# Patient Record
Sex: Female | Born: 1965 | Race: White | Hispanic: No | Marital: Married | Smoking: Never smoker
Health system: Southern US, Community
[De-identification: ages and names within clinical notes are randomized; demographics above are authoritative.]

## PROBLEM LIST (undated history)

## (undated) DIAGNOSIS — I10 Essential (primary) hypertension: Secondary | ICD-10-CM

## (undated) DIAGNOSIS — C801 Malignant (primary) neoplasm, unspecified: Secondary | ICD-10-CM

---

## 2020-02-22 ENCOUNTER — Emergency Department (HOSPITAL_COMMUNITY): Payer: Self-pay

## 2020-02-22 ENCOUNTER — Encounter (HOSPITAL_COMMUNITY): Payer: Self-pay | Admitting: Emergency Medicine

## 2020-02-22 ENCOUNTER — Inpatient Hospital Stay (HOSPITAL_COMMUNITY)
Admission: EM | Admit: 2020-02-22 | Discharge: 2020-03-16 | DRG: 951 | Disposition: E | Payer: Self-pay | Attending: Internal Medicine | Admitting: Internal Medicine

## 2020-02-22 ENCOUNTER — Other Ambulatory Visit: Payer: Self-pay

## 2020-02-22 DIAGNOSIS — Z923 Personal history of irradiation: Secondary | ICD-10-CM

## 2020-02-22 DIAGNOSIS — C7880 Secondary malignant neoplasm of unspecified digestive organ: Secondary | ICD-10-CM | POA: Diagnosis present

## 2020-02-22 DIAGNOSIS — C799 Secondary malignant neoplasm of unspecified site: Secondary | ICD-10-CM | POA: Diagnosis present

## 2020-02-22 DIAGNOSIS — E871 Hypo-osmolality and hyponatremia: Secondary | ICD-10-CM | POA: Diagnosis present

## 2020-02-22 DIAGNOSIS — K668 Other specified disorders of peritoneum: Secondary | ICD-10-CM

## 2020-02-22 DIAGNOSIS — R531 Weakness: Secondary | ICD-10-CM

## 2020-02-22 DIAGNOSIS — Z515 Encounter for palliative care: Principal | ICD-10-CM | POA: Diagnosis present

## 2020-02-22 DIAGNOSIS — A4159 Other Gram-negative sepsis: Secondary | ICD-10-CM | POA: Diagnosis present

## 2020-02-22 DIAGNOSIS — I959 Hypotension, unspecified: Secondary | ICD-10-CM | POA: Diagnosis present

## 2020-02-22 DIAGNOSIS — Z7189 Other specified counseling: Secondary | ICD-10-CM

## 2020-02-22 DIAGNOSIS — Z79899 Other long term (current) drug therapy: Secondary | ICD-10-CM

## 2020-02-22 DIAGNOSIS — I10 Essential (primary) hypertension: Secondary | ICD-10-CM | POA: Diagnosis present

## 2020-02-22 DIAGNOSIS — C7951 Secondary malignant neoplasm of bone: Secondary | ICD-10-CM | POA: Diagnosis present

## 2020-02-22 DIAGNOSIS — K631 Perforation of intestine (nontraumatic): Secondary | ICD-10-CM | POA: Diagnosis present

## 2020-02-22 DIAGNOSIS — N179 Acute kidney failure, unspecified: Secondary | ICD-10-CM | POA: Diagnosis present

## 2020-02-22 DIAGNOSIS — C55 Malignant neoplasm of uterus, part unspecified: Secondary | ICD-10-CM | POA: Diagnosis present

## 2020-02-22 DIAGNOSIS — Z66 Do not resuscitate: Secondary | ICD-10-CM | POA: Diagnosis present

## 2020-02-22 DIAGNOSIS — E860 Dehydration: Secondary | ICD-10-CM | POA: Diagnosis present

## 2020-02-22 DIAGNOSIS — Z20822 Contact with and (suspected) exposure to covid-19: Secondary | ICD-10-CM | POA: Diagnosis present

## 2020-02-22 HISTORY — DX: Malignant (primary) neoplasm, unspecified: C80.1

## 2020-02-22 HISTORY — DX: Essential (primary) hypertension: I10

## 2020-02-22 LAB — CBC WITH DIFFERENTIAL/PLATELET
Abs Immature Granulocytes: 0.21 10*3/uL — ABNORMAL HIGH (ref 0.00–0.07)
Basophils Absolute: 0.1 10*3/uL (ref 0.0–0.1)
Basophils Relative: 1 %
Eosinophils Absolute: 0 10*3/uL (ref 0.0–0.5)
Eosinophils Relative: 0 %
HCT: 30.2 % — ABNORMAL LOW (ref 36.0–46.0)
Hemoglobin: 10.1 g/dL — ABNORMAL LOW (ref 12.0–15.0)
Immature Granulocytes: 1 %
Lymphocytes Relative: 2 %
Lymphs Abs: 0.4 10*3/uL — ABNORMAL LOW (ref 0.7–4.0)
MCH: 31.7 pg (ref 26.0–34.0)
MCHC: 33.4 g/dL (ref 30.0–36.0)
MCV: 94.7 fL (ref 80.0–100.0)
Monocytes Absolute: 0.3 10*3/uL (ref 0.1–1.0)
Monocytes Relative: 2 %
Neutro Abs: 16.2 10*3/uL — ABNORMAL HIGH (ref 1.7–7.7)
Neutrophils Relative %: 94 %
Platelets: 234 10*3/uL (ref 150–400)
RBC: 3.19 MIL/uL — ABNORMAL LOW (ref 3.87–5.11)
RDW: 18.1 % — ABNORMAL HIGH (ref 11.5–15.5)
WBC: 17.2 10*3/uL — ABNORMAL HIGH (ref 4.0–10.5)
nRBC: 0.2 % (ref 0.0–0.2)

## 2020-02-22 LAB — COMPREHENSIVE METABOLIC PANEL
ALT: 20 U/L (ref 0–44)
AST: 14 U/L — ABNORMAL LOW (ref 15–41)
Albumin: 2.1 g/dL — ABNORMAL LOW (ref 3.5–5.0)
Alkaline Phosphatase: 77 U/L (ref 38–126)
Anion gap: 17 — ABNORMAL HIGH (ref 5–15)
BUN: 76 mg/dL — ABNORMAL HIGH (ref 6–20)
CO2: 20 mmol/L — ABNORMAL LOW (ref 22–32)
Calcium: 7.6 mg/dL — ABNORMAL LOW (ref 8.9–10.3)
Chloride: 89 mmol/L — ABNORMAL LOW (ref 98–111)
Creatinine, Ser: 3.78 mg/dL — ABNORMAL HIGH (ref 0.44–1.00)
GFR calc Af Amer: 15 mL/min — ABNORMAL LOW (ref >60)
GFR calc non Af Amer: 13 mL/min — ABNORMAL LOW (ref >60)
Glucose, Bld: 106 mg/dL — ABNORMAL HIGH (ref 70–99)
Potassium: 3.7 mmol/L (ref 3.5–5.1)
Sodium: 126 mmol/L — ABNORMAL LOW (ref 135–145)
Total Bilirubin: 1.1 mg/dL (ref 0.3–1.2)
Total Protein: 5.5 g/dL — ABNORMAL LOW (ref 6.5–8.1)

## 2020-02-22 LAB — LACTIC ACID, PLASMA: Lactic Acid, Venous: 2.1 mmol/L (ref 0.5–1.9)

## 2020-02-22 LAB — SARS CORONAVIRUS 2 BY RT PCR (HOSPITAL ORDER, PERFORMED IN ~~LOC~~ HOSPITAL LAB): SARS Coronavirus 2: NEGATIVE

## 2020-02-22 MED ORDER — HALOPERIDOL LACTATE 2 MG/ML PO CONC
0.5000 mg | ORAL | Status: DC | PRN
  Filled 2020-02-22: qty 0.3

## 2020-02-22 MED ORDER — ONDANSETRON HCL 4 MG/2ML IJ SOLN: 4.0000 mg | Freq: Four times a day (QID) | INTRAMUSCULAR | Status: DC | PRN

## 2020-02-22 MED ORDER — BIOTENE DRY MOUTH MT LIQD: 15.0000 mL | OROMUCOSAL | Status: DC | PRN

## 2020-02-22 MED ORDER — METRONIDAZOLE IN NACL 5-0.79 MG/ML-% IV SOLN
500.0000 mg | Freq: Once | INTRAVENOUS | Status: AC
  Administered 2020-02-22: 500 mg via INTRAVENOUS
  Filled 2020-02-22: qty 100

## 2020-02-22 MED ORDER — ONDANSETRON HCL 4 MG/2ML IJ SOLN
4.0000 mg | Freq: Once | INTRAMUSCULAR | Status: AC
  Administered 2020-02-22: 4 mg via INTRAVENOUS
  Filled 2020-02-22: qty 2

## 2020-02-22 MED ORDER — HALOPERIDOL 0.5 MG PO TABS
0.5000 mg | ORAL_TABLET | ORAL | Status: DC | PRN
  Filled 2020-02-22: qty 1

## 2020-02-22 MED ORDER — GLYCOPYRROLATE 0.2 MG/ML IJ SOLN: 0.2000 mg | INTRAMUSCULAR | Status: DC | PRN

## 2020-02-22 MED ORDER — GLYCOPYRROLATE 0.2 MG/ML IJ SOLN
0.2000 mg | INTRAMUSCULAR | Status: DC | PRN
  Administered 2020-02-23 (×2): 0.2 mg via INTRAVENOUS
  Filled 2020-02-22 (×2): qty 1

## 2020-02-22 MED ORDER — GLYCOPYRROLATE 1 MG PO TABS
1.0000 mg | ORAL_TABLET | ORAL | Status: DC | PRN
  Filled 2020-02-22: qty 1

## 2020-02-22 MED ORDER — LORAZEPAM 2 MG/ML IJ SOLN
1.0000 mg | INTRAMUSCULAR | Status: DC | PRN
  Administered 2020-02-23: 1 mg via INTRAVENOUS
  Filled 2020-02-22: qty 1

## 2020-02-22 MED ORDER — ONDANSETRON 4 MG PO TBDP: 4.0000 mg | ORAL_TABLET | Freq: Four times a day (QID) | ORAL | Status: DC | PRN

## 2020-02-22 MED ORDER — MORPHINE SULFATE (PF) 2 MG/ML IV SOLN
2.0000 mg | INTRAVENOUS | Status: DC | PRN
  Administered 2020-02-23: 2 mg via INTRAVENOUS
  Filled 2020-02-22: qty 1

## 2020-02-22 MED ORDER — ACETAMINOPHEN 325 MG PO TABS: 650.0000 mg | ORAL_TABLET | Freq: Four times a day (QID) | ORAL | Status: DC | PRN

## 2020-02-22 MED ORDER — ACETAMINOPHEN 650 MG RE SUPP: 650.0000 mg | Freq: Four times a day (QID) | RECTAL | Status: DC | PRN

## 2020-02-22 MED ORDER — LORAZEPAM 2 MG/ML PO CONC: 1.0000 mg | ORAL | Status: DC | PRN

## 2020-02-22 MED ORDER — IOHEXOL 9 MG/ML PO SOLN
ORAL | Status: AC
  Filled 2020-02-22: qty 1000

## 2020-02-22 MED ORDER — MORPHINE SULFATE (PF) 4 MG/ML IV SOLN
4.0000 mg | Freq: Once | INTRAVENOUS | Status: DC
  Filled 2020-02-22: qty 1

## 2020-02-22 MED ORDER — HALOPERIDOL LACTATE 5 MG/ML IJ SOLN: 0.5000 mg | INTRAMUSCULAR | Status: DC | PRN

## 2020-02-22 MED ORDER — LORAZEPAM 1 MG PO TABS: 1.0000 mg | ORAL_TABLET | ORAL | Status: DC | PRN

## 2020-02-22 MED ORDER — SODIUM CHLORIDE 0.9 % IV BOLUS (SEPSIS)
1000.0000 mL | Freq: Once | INTRAVENOUS | Status: AC
  Administered 2020-02-22: 1000 mL via INTRAVENOUS

## 2020-02-22 MED ORDER — SODIUM CHLORIDE 0.9 % IV BOLUS
1000.0000 mL | Freq: Once | INTRAVENOUS | Status: AC
  Administered 2020-02-22: 1000 mL via INTRAVENOUS

## 2020-02-22 MED ORDER — POLYVINYL ALCOHOL 1.4 % OP SOLN: 1.0000 [drp] | Freq: Four times a day (QID) | OPHTHALMIC | Status: DC | PRN

## 2020-02-22 MED ORDER — SODIUM CHLORIDE 0.9 % IV SOLN
2.0000 g | Freq: Once | INTRAVENOUS | Status: AC
  Administered 2020-02-22: 2 g via INTRAVENOUS
  Filled 2020-02-22: qty 2

## 2020-02-22 MED ORDER — SODIUM CHLORIDE 0.9 % IV SOLN: INTRAVENOUS | Status: DC

## 2020-02-22 NOTE — ED Triage Notes (Signed)
Pt from home on hospice care. [facility] was called out for transport, hospice caregiver stated pt has confusion, low bp, cough, and retaining fluid. Ems states bp was 103/40 on arrival.

## 2020-02-22 NOTE — H&P (Signed)
[facility] History and Physical  Sherry Arnold ZOX:[id number] DOB: 07-20-1965 DOA: 03/10/2020   PCP: Bridget Hartshorn, NP  Specialists: Patient followed by oncology at [facility] in [place] Complaint: Abdominal pain  HPI: Sherry Arnold is a 54 y.o. female with a past medical history of metastatic uterine cancer who was under hospice care at home.  She was seen by oncology at [facility].  It appears the patient was diagnosed with the cancer back in May.  She apparently underwent radiation treatments but does not look like she received any chemotherapy.  Unclear when the patient was transitioned to hospice but the patient presented mainly because of her weakness poor oral intake and abdominal pain.  She also developed abdominal distention.  She presented to the ED.  Currently patient is noted to be somewhat drowsy after receiving some medications.  Her husband is at the bedside.  Patient mentions that her abdominal pain is better at this time.  In the emergency department she underwent CT scan which showed evidence for bowel perforation likely secondary to a large mass noted in the abdomen which was encasing the bowel loops.  Patient has extremely poor prognosis.  She is not a surgical candidate.  This is likely a terminal event.  Patient and her husband understand.  She is also noted to be hypotensive.  Currently not stable to be sent back home.  She would however like to go home tomorrow if possible.  She will be hospitalized for pain control.  Home Medications: Prior to Admission medications   Medication Sig Start Date End Date Taking? Authorizing Provider  ibuprofen (ADVIL) 800 MG tablet Take 800 mg by mouth every 8 (eight) hours as needed. 12/24/19  Yes [provider]  lisinopril-hydrochlorothiazide (ZESTORETIC) 20-25 MG tablet Take 1 tablet by mouth daily. 12/29/19  Yes [provider]  Vitamin D, Ergocalciferol, (DRISDOL) 1.25 MG (50000 UNIT)  CAPS capsule Take 50,000 Units by mouth once a week. 12/20/19  Yes [provider]    Allergies: No Known Allergies  Past Medical History: Past Medical History:  Diagnosis Date  . Cancer ([place])   . Hypertension     History reviewed. No pertinent surgical history.  Social History: Lives with her husband.  No other information is available at this time.  Family History:  Unable to obtain as patient is drowsy.  Review of Systems -unable to do as the patient is drowsy  Physical Examination  Vitals:   02/26/2020 1755 02/26/2020 1756 02/27/2020 1900 02/21/2020 2028  BP:  (!) 74/44 (!) 50/34 (!) 140/123  Pulse: 98  63 88  Resp:   (!) 21 (!) 24  Temp:    97.6 F (36.4 C)  TempSrc:      SpO2: 98%  (!) 77%   Weight:    117.9 kg  Height:        BP (!) 140/123 (BP Location: Right Arm)   Pulse 88   Temp 97.6 F (36.4 C)   Resp (!) 24   Ht 5\' 5"  (1.651 m)   Wt 117.9 kg   SpO2 (!) 77%   BMI 43.25 kg/m   General appearance: alert, distracted, fatigued and no distress Head: Normocephalic, without obvious abnormality, atraumatic Eyes: conjunctivae/corneas clear.  Pupils are equal and reactive Neck: no adenopathy, no carotid bruit, no JVD, supple, symmetrical, trachea midline and thyroid not enlarged, symmetric, no tenderness/mass/nodules Resp: Crackles and rhonchi diffusely bilaterally.  Noted to be tachypneic. Cardio: regular rate and rhythm, S1,  S2 normal, no murmur, click, rub or gallop GI: Abdomen is significantly distended tender diffusely.  Some rigidity is appreciated. Extremities: Edema bilateral lower extremities Skin: Skin color, texture, turgor normal. No rashes or lesions Lymph nodes: Cervical, supraclavicular, and axillary nodes normal. Neurologic: Drowsy but arousable.  No obvious focal deficits noted.    Labs on Admission: I have personally reviewed following labs and imaging studies  CBC: Recent Labs  Lab 03/11/2020 1423  WBC 17.2*  NEUTROABS 16.2*  HGB  10.1*  HCT 30.2*  MCV 94.7  PLT 660   Basic Metabolic Panel: Recent Labs  Lab 03/13/2020 1423  NA 126*  K 3.7  CL 89*  CO2 20*  GLUCOSE 106*  BUN 76*  CREATININE 3.78*  CALCIUM 7.6*   GFR: Estimated Creatinine Clearance: 21.4 mL/min (A) (by C-G formula based on SCr of 3.78 mg/dL (H)). Liver Function Tests: Recent Labs  Lab 02/16/2020 1423  AST 14*  ALT 20  ALKPHOS 77  BILITOT 1.1  PROT 5.5*  ALBUMIN 2.1*     Radiological Exams on Admission: CT Abdomen Pelvis Wo Contrast  Result Date: 03/02/2020 CLINICAL DATA:  54 year old female with generalized abdominal pain. Metastatic uterine cancer under hospice care. EXAM: CT ABDOMEN AND PELVIS WITHOUT CONTRAST TECHNIQUE: Multidetector CT imaging of the abdomen and pelvis was performed following the standard protocol without IV contrast. COMPARISON:  None. FINDINGS: Lower chest: Multiple round lung base metastases. Superimposed bilateral peribronchial thickening or atelectasis. No pericardial or pleural effusion. Hepatobiliary: Absent gallbladder. No liver metastasis is evident in the absence of contrast. Pancreas: Negative. Spleen: Negative. Adrenals/Urinary Tract: Negative adrenal glands. Left renal midpole low-density cyst. No renal obstruction. Urinary bladder is partially inseparable from bulky pelvic tumor, although is seen to be decompressed on sagittal image 86. Stomach/Bowel: Large volume free intraperitoneal air. Bulky soft tissue tumor arising out of the pelvis is inseparable from small bowel loops in the mid abdomen (series 2, image 79 and coronal image 63), the sigmoid colon in the lower abdomen and pelvis (series 2, image 75) and also inseparable from the retroperitoneal portion of the duodenum (series 2, image 43). Fluid in the distal large bowel. Gas distended transverse colon. Terminal ileum and cecum appear fairly normal. No dilated small bowel. Vascular/Lymphatic: Normal caliber aorta. Vascular patency is not evaluated in the  absence of IV contrast. Aortoiliac calcified atherosclerosis. No portal venous gas identified. No lymphadenopathy. Reproductive: Lobulated nearly 30 cm soft tissue mass tracks from the central pelvis cephalad to the level of the duodenum (sagittal image 77) and is nearly 30 cm transverse at the level of the mid abdomen also (series 2, image 65). No normal uterine or adnexal parenchyma. Other: No pelvic free fluid. Musculoskeletal: Lytic metastases in the lumbar spine and sacrum. Expansile osseous metastasis in the right lateral 9th rib. Lytic metastases in the bilateral iliac bones, pubic rami and proximal right femur. Bulky extraosseous extension of tumor from the right inferior pubic symphysis. No overt pathologic fracture identified. IMPRESSION: 1. Large volume of free air in the abdomen in keeping with perforated bowel secondary to a large (30 cm) tumor in the abdomen and pelvis which engulfs both small and large bowel loops. 2. Extensive lytic skeletal metastases. Pulmonary metastases at the lung bases. 3. Critical Value/emergent results were called by telephone at the time of interpretation on 02/21/2020 at 5:44 pm to provider Dr. Sedonia Small in the ED, who verbally acknowledged these results. Electronically Signed   By: Genevie Ann M.D.   On: 03/01/2020 17:49  DG Chest Port 1 View  Result Date: 03/02/2020 CLINICAL DATA:  Sepsis, cough. EXAM: PORTABLE CHEST 1 VIEW COMPARISON:  None. FINDINGS: The heart size and mediastinal contours are within normal limits. Hypoinflation of the lungs is noted with mild bibasilar subsegmental atelectasis. No pneumothorax or pleural effusion is noted. However, free air is noted under both hemidiaphragms consistent with pneumoperitoneum and concerning for rupture of hollow viscus. The visualized skeletal structures are unremarkable. IMPRESSION: 1. Hypoinflation of the lungs is noted with mild bibasilar subsegmental atelectasis. 2. Pneumoperitoneum is noted under both hemidiaphragms  consistent with rupture of hollow viscus. CT scan of the abdomen and pelvis is recommended for further evaluation. Critical Value/emergent results were called by telephone at the time of interpretation on 03/07/2020 at 2:30 pm to provider Kem Parkinson, who verbally acknowledged these results and will relay them to Dr. Ashok Cordia. Electronically Signed   By: Marijo Conception M.D.   On: 03/02/2020 14:30    My interpretation of Electrocardiogram: Sinus tachycardia is noted.  Diffuse T wave changes.   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  She was already under hospice care at home.  This is likely a terminal event for her.  Plan:  1. Metastatic uterine leiomyosarcoma with bowel perforation: This is a terminal event.  This was discussed with patient and her husband.  She will be placed on comfort measures.  End-of-life order set has been initiated.  We will consult palliative care to assist with symptom management and with disposition.  Patient would like to go home if possible.  Try to facilitate this in the morning if she stabilizes.  2.  Patient found to have multiple abnormalities on blood work including hyponatremia, acute renal failure, leukocytosis, anemia.  Due to initiation of comfort care these issues will not be addressed.  Gentle hydration will be provided.   DVT Prophylaxis: None due to comfort care Code Status: DNR Family Communication: Discussed with the patient's husband Disposition: If she stabilizes attempt could be made to send her home with hospice in the morning. Consults called: Palliative care Admission Status: Status is:  Observation  The patient remains OBS appropriate and will d/c before 2 midnights.  Dispo: The patient is from: Home              Anticipated d/c is to: Home              Anticipated d/c date is: 1 day              Patient currently is not medically stable to d/c.   Severity of Illness: The appropriate patient status for this patient is OBSERVATION. Observation status is judged to be reasonable and necessary in order to provide the required intensity of service to ensure the patient's safety. The patient's presenting symptoms, physical exam findings, and initial radiographic and laboratory data in the context of their medical condition is felt to place them at decreased risk for further clinical deterioration. Furthermore, it is anticipated that the patient will be medically stable for discharge from the hospital within 2 midnights of admission. The following factors support the patient status of observation.   " The  patient's presenting symptoms include abdominal pain. " The physical exam findings include abdominal distention. " The initial radiographic and laboratory data are worrisome for bowel perforation.   Further management decisions will depend on results of further testing and patient's response to treatment.   Finley Chevez [employer]  [facility] operational officer on [email or web address]  03/04/2020, 8:34 PM

## 2020-02-22 NOTE — ED Provider Notes (Signed)
[facility] EMERGENCY DEPARTMENT Provider Note   CSN: [id number] Arrival date & time: 03/09/2020  1231     History Chief Complaint  Patient presents with  . Weakness    Sherry Arnold is a 54 y.o. female.  Patient with hx metastatic uterine cancer, hospice care patient, presents from home via EMS. Pt indicates generally weak with poor po intake in past week. Symptoms gradual onset, mod-severe, constant, persistent. Notes mild sob/dyspnea with activity. No chest pain or discomfort. No fever or chills. EMS indicates caregiver noted non prod cough. No sore throat or runny nose. Pt denies known covid exposure. Pt has chronic bilateral leg swelling, no acute change. Denies headache. States abd is chronically distended, and has chronically decreased appetite. Denies vomiting, and indicates is having normal bms. Denies gu c/o.   The history is provided by the patient.  Cough Associated symptoms: no chest pain, no chills, no fever, no headaches, no rash and no sore throat        Past Medical History:  Diagnosis Date  . Cancer ([place])   . Hypertension     There are no problems to display for this patient.   History reviewed. No pertinent surgical history.   OB History   No obstetric history on file.     History reviewed. No pertinent family history.  Social History   Tobacco Use  . Smoking status: Not on file  Substance Use Topics  . Alcohol use: Not on file  . Drug use: Not on file    Home Medications Prior to Admission medications   Medication Sig Start Date End Date Taking? Authorizing Provider  ibuprofen (ADVIL) 800 MG tablet Take 800 mg by mouth every 8 (eight) hours as needed. 12/24/19  Yes [provider]  lisinopril-hydrochlorothiazide (ZESTORETIC) 20-25 MG tablet Take 1 tablet by mouth daily. 12/29/19  Yes [provider]  Vitamin D, Ergocalciferol, (DRISDOL) 1.25 MG (50000 UNIT) CAPS capsule Take 50,000 Units by mouth once a week. 12/20/19  Yes  [provider]    Allergies    Patient has no known allergies.  Review of Systems   Review of Systems  Constitutional: Negative for chills and fever.  HENT: Negative for sore throat.   Eyes: Negative for redness.  Respiratory: Positive for cough.   Cardiovascular: Negative for chest pain.  Gastrointestinal: Negative for abdominal pain and vomiting.  Endocrine: Negative for polyuria.  Genitourinary: Negative for dysuria and flank pain.  Musculoskeletal: Negative for neck pain and neck stiffness.  Skin: Negative for rash.  Neurological: Negative for headaches.  Hematological: Does not bruise/bleed easily.  Psychiatric/Behavioral: Negative for confusion.    Physical Exam Updated Vital Signs BP (!) 77/44   Pulse (!) 48   Temp (!) 97.4 F (36.3 C) (Oral)   Resp (!) 21   Ht 1.651 m (5\' 5" )   Wt 111.1 kg   SpO2 (!) 85%   BMI 40.77 kg/m   Physical Exam Vitals and nursing note reviewed.  Constitutional:      Appearance: She is well-developed.     Comments: Chronically weak/ill appearing.   HENT:     Head: Atraumatic.     Nose: Nose normal.     Mouth/Throat:     Comments: Dry mucous membranes Eyes:     General: No scleral icterus.    Conjunctiva/sclera: Conjunctivae normal.     Pupils: Pupils are equal, round, and reactive to light.  Neck:     Trachea: No tracheal deviation.  Cardiovascular:  Rate and Rhythm: Regular rhythm. Tachycardia present.     Pulses: Normal pulses.     Heart sounds: Normal heart sounds. No murmur heard.  No friction rub. No gallop.   Pulmonary:     Effort: Pulmonary effort is normal. No respiratory distress.     Breath sounds: Normal breath sounds.  Abdominal:     General: Bowel sounds are normal. There is distension.     Palpations: Abdomen is soft.     Tenderness: There is abdominal tenderness. There is no guarding or rebound.     Comments: +abd distension. Tenderness mid to upper abd.   Genitourinary:    Comments: No cva  tenderness.  Musculoskeletal:     Cervical back: Normal range of motion and neck supple. No rigidity. No muscular tenderness.     Comments: Symmetric bil leg swelling.   Skin:    General: Skin is warm and dry.     Findings: No rash.  Neurological:     Mental Status: She is alert.     Comments: Alert, speech normal. Motor/sens grossly intact bil.   Psychiatric:        Mood and Affect: Mood normal.     ED Results / Procedures / Treatments   Labs (all labs ordered are listed, but only abnormal results are displayed) Results for orders placed or performed during the hospital encounter of 03/10/2020  Lactic acid, plasma  Result Value Ref Range   Lactic Acid, Venous 2.1 (HH) 0.5 - 1.9 mmol/L  Comprehensive metabolic panel  Result Value Ref Range   Sodium 126 (L) 135 - 145 mmol/L   Potassium 3.7 3.5 - 5.1 mmol/L   Chloride 89 (L) 98 - 111 mmol/L   CO2 20 (L) 22 - 32 mmol/L   Glucose, Bld 106 (H) 70 - 99 mg/dL   BUN 76 (H) 6 - 20 mg/dL   Creatinine, Ser 3.78 (H) 0.44 - 1.00 mg/dL   Calcium 7.6 (L) 8.9 - 10.3 mg/dL   Total Protein 5.5 (L) 6.5 - 8.1 g/dL   Albumin 2.1 (L) 3.5 - 5.0 g/dL   AST 14 (L) 15 - 41 U/L   ALT 20 0 - 44 U/L   Alkaline Phosphatase 77 38 - 126 U/L   Total Bilirubin 1.1 0.3 - 1.2 mg/dL   GFR calc non Af Amer 13 (L) >60 mL/min   GFR calc Af Amer 15 (L) >60 mL/min   Anion gap 17 (H) 5 - 15  CBC WITH DIFFERENTIAL  Result Value Ref Range   WBC 17.2 (H) 4.0 - 10.5 K/uL   RBC 3.19 (L) 3.87 - 5.11 MIL/uL   Hemoglobin 10.1 (L) 12.0 - 15.0 g/dL   HCT 30.2 (L) 36 - 46 %   MCV 94.7 80.0 - 100.0 fL   MCH 31.7 26.0 - 34.0 pg   MCHC 33.4 30.0 - 36.0 g/dL   RDW 18.1 (H) 11.5 - 15.5 %   Platelets 234 150 - 400 K/uL   nRBC 0.2 0.0 - 0.2 %   Neutrophils Relative % 94 %   Neutro Abs 16.2 (H) 1.7 - 7.7 K/uL   Lymphocytes Relative 2 %   Lymphs Abs 0.4 (L) 0.7 - 4.0 K/uL   Monocytes Relative 2 %   Monocytes Absolute 0.3 0 - 1 K/uL   Eosinophils Relative 0 %    Eosinophils Absolute 0.0 0 - 0 K/uL   Basophils Relative 1 %   Basophils Absolute 0.1 0 - 0 K/uL   Immature  Granulocytes 1 %   Abs Immature Granulocytes 0.21 (H) 0.00 - 0.07 K/uL   DG Chest Port 1 View  Result Date: 02/15/2020 CLINICAL DATA:  Sepsis, cough. EXAM: PORTABLE CHEST 1 VIEW COMPARISON:  None. FINDINGS: The heart size and mediastinal contours are within normal limits. Hypoinflation of the lungs is noted with mild bibasilar subsegmental atelectasis. No pneumothorax or pleural effusion is noted. However, free air is noted under both hemidiaphragms consistent with pneumoperitoneum and concerning for rupture of hollow viscus. The visualized skeletal structures are unremarkable. IMPRESSION: 1. Hypoinflation of the lungs is noted with mild bibasilar subsegmental atelectasis. 2. Pneumoperitoneum is noted under both hemidiaphragms consistent with rupture of hollow viscus. CT scan of the abdomen and pelvis is recommended for further evaluation. Critical Value/emergent results were called by telephone at the time of interpretation on 03/14/2020 at 2:30 pm to provider Kem Parkinson, who verbally acknowledged these results and will relay them to Dr. Ashok Cordia. Electronically Signed   By: Marijo Conception M.D.   On: 02/17/2020 14:30    EKG EKG Interpretation  Date/Time:  Wednesday February 22 2020 12:49:53 EDT Ventricular Rate:  100 PR Interval:    QRS Duration: 96 QT Interval:  306 QTC Calculation: 395 R Axis:   51 Text Interpretation: Sinus tachycardia Non-specific ST-t changes No previous tracing Confirmed by Lajean Saver [phone]) on 02/27/2020 3:00:00 PM   Radiology DG Chest Port 1 View  Result Date: 02/19/2020 CLINICAL DATA:  Sepsis, cough. EXAM: PORTABLE CHEST 1 VIEW COMPARISON:  None. FINDINGS: The heart size and mediastinal contours are within normal limits. Hypoinflation of the lungs is noted with mild bibasilar subsegmental atelectasis. No pneumothorax or pleural effusion is noted. However,  free air is noted under both hemidiaphragms consistent with pneumoperitoneum and concerning for rupture of hollow viscus. The visualized skeletal structures are unremarkable. IMPRESSION: 1. Hypoinflation of the lungs is noted with mild bibasilar subsegmental atelectasis. 2. Pneumoperitoneum is noted under both hemidiaphragms consistent with rupture of hollow viscus. CT scan of the abdomen and pelvis is recommended for further evaluation. Critical Value/emergent results were called by telephone at the time of interpretation on 03/12/2020 at 2:30 pm to provider Kem Parkinson, who verbally acknowledged these results and will relay them to Dr. Ashok Cordia. Electronically Signed   By: Marijo Conception M.D.   On: 02/16/2020 14:30    Procedures Procedures (including critical care time)  Medications Ordered in ED Medications  iohexol (OMNIPAQUE) 9 MG/ML oral solution (has no administration in time range)  ceFEPIme (MAXIPIME) 2 g in sodium chloride 0.9 % 100 mL IVPB (2 g Intravenous New Bag/Given 02/15/2020 1523)    And  metroNIDAZOLE (FLAGYL) IVPB 500 mg (500 mg Intravenous New Bag/Given 03/11/2020 1522)  sodium chloride 0.9 % bolus 1,000 mL (0 mLs Intravenous Stopped 02/25/2020 1524)  sodium chloride 0.9 % bolus 1,000 mL (1,000 mLs Intravenous New Bag/Given  1520)    ED Course  I have reviewed the triage vital signs and the nursing notes.  Pertinent labs & imaging results that were available during my care of the patient were reviewed by me and considered in my medical decision making (see chart for details).    MDM Rules/Calculators/A&P                         Iv ns bolus. Labs sent. Continuous pulse ox and monitor.   MDM Number of Diagnoses or Management Options   Amount and/or Complexity of Data Reviewed Clinical lab tests:  ordered and reviewed Tests in the radiology section of CPT: ordered and reviewed Tests in the medicine section of CPT: ordered and reviewed Discussion of test results with the  performing providers: yes Decide to obtain previous medical records or to obtain history from someone other than the patient: yes Obtain history from someone other than the patient: yes Review and summarize past medical records: yes Discuss the patient with other providers: yes Independent visualization of images, tracings, or specimens: yes  Risk of Complications, Morbidity, and/or Mortality Presenting problems: high Diagnostic procedures: high   Reviewed nursing notes and prior charts for additional history.   Discussed code status with patient - patient indicates no intubation/vent, or chest compressions/cpr, but does want medical treatment, iv fluids, oxygen, meds.   Additional ns bolus iv. Iv abx given.   Patients ideal body wt 57 kg. Pt given in excess 30 cc/kg (ibw).   Cxr reviewed/interpreted by me - no pna. +free air. Discussed w pt.   Reviewed nursing notes and prior charts for additional history. Note that during admission to [facility] 01/2020, pt with ct with extensive metastatic ca to bone and throughout abd/mesentary/bowel, including causing obstruction sb/colon, and right ureter - decision then for no surgery and hospice care.   CT pending to confirm/characterize free air. It appears patient not surgical candidate, but that likely will require admission for symptom control and palliative care.   1545, signed out to Dr Sedonia Small to check ct, additional labs when resulted, and facilitate appropriate care/admission.    Morphine for pain, zofran for nausea.   CRITICAL CARE RE: hypotension, metastatic cancer, hyponatremia, free intraperitoneal air/perforated viscus Performed by: Mirna Mires Total critical care time: 110 minutes Critical care time was exclusive of separately billable procedures and treating other patients. Critical care was necessary to treat or prevent imminent or life-threatening deterioration. Critical care was time spent personally by me on the following  activities: development of treatment plan with patient and/or surrogate as well as nursing, discussions with consultants, evaluation of patient's response to treatment, examination of patient, obtaining history from patient or surrogate, ordering and performing treatments and interventions, ordering and review of laboratory studies, ordering and review of radiographic studies, pulse oximetry and re-evaluation of patient's condition.   Final Clinical Impression(s) / ED Diagnoses Final diagnoses:  None    Rx / DC Orders ED Discharge Orders    None       Lajean Saver, MD 03/09/2020 1559

## 2020-02-22 NOTE — ED Notes (Signed)
Pt comfort care. Multiple family in room

## 2020-02-22 NOTE — ED Provider Notes (Signed)
  Provider Note MRN:  [id number]  Arrival date & time: 02/17/2020    ED Course and Medical Decision Making  Assumed care from Dr. Ashok Cordia at shift change.  Metastatic cancer here with poor p.o. intake, x-ray concerning for free air in the abdomen.  Awaiting CT, will need admission.  Likely not an operative candidate.  Patient noted to be in went to evaluate the patient who seemed awake, alert, well-perfused.  We discussed the low blood pressure and the likely perforated bowel, discussed the option of pressor medications.  We discussed that the use of pressors would not treat the underlying condition and would likely only prolong her current state.  We decided to hold off on pressors at this time.  CT imaging confirms perforation, admitted to hospital service for further care, anticipating transition to comfort care soon.   .Critical Care Performed by: Maudie Flakes, MD Authorized by: Maudie Flakes, MD   Critical care provider statement:    Critical care time (minutes):  32   Critical care was necessary to treat or prevent imminent or life-threatening deterioration of the following conditions: Perforated bowel, end-of-life care discussions.   Critical care was time spent personally by me on the following activities:  Discussions with consultants, evaluation of patient's response to treatment, examination of patient, ordering and performing treatments and interventions, ordering and review of laboratory studies, ordering and review of radiographic studies, pulse oximetry, re-evaluation of patient's condition, obtaining history from patient or surrogate and review of old charts   I assumed direction of critical care for this patient from another provider in my specialty: yes      Final Clinical Impressions(s) / ED Diagnoses     ICD-10-CM   1. Generalized weakness  R53.1   2. Dehydration  E86.0   3. 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 Free intraperitoneal air  K66.8     5. Hypotension, unspecified hypotension type  I95.9   6. Hyponatremia  E87.1   7. AKI (acute kidney injury) [facility])  N17.9     ED Discharge Orders    None      Discharge Instructions   None     Barth Kirks. [place] Small, [place] [email or web address]    Maudie Flakes, MD 02/26/2020 [phone]

## 2020-02-23 DIAGNOSIS — C55 Malignant neoplasm of uterus, part unspecified: Secondary | ICD-10-CM

## 2020-02-23 DIAGNOSIS — Z7189 Other specified counseling: Secondary | ICD-10-CM

## 2020-02-23 DIAGNOSIS — C799 Secondary malignant neoplasm of unspecified site: Secondary | ICD-10-CM

## 2020-02-23 DIAGNOSIS — Z515 Encounter for palliative care: Principal | ICD-10-CM

## 2020-02-23 DIAGNOSIS — K631 Perforation of intestine (nontraumatic): Secondary | ICD-10-CM

## 2020-02-23 LAB — BLOOD CULTURE ID PANEL (REFLEXED) - BCID2

## 2020-02-23 MED ORDER — MORPHINE BOLUS VIA INFUSION
2.0000 mg | INTRAVENOUS | Status: DC | PRN
  Filled 2020-02-23: qty 2

## 2020-02-23 MED ORDER — MORPHINE 100MG IN NS 100ML (1MG/ML) PREMIX INFUSION
1.0000 mg/h | INTRAVENOUS | Status: DC
  Administered 2020-02-23: 1 mg/h via INTRAVENOUS
  Filled 2020-02-23: qty 100

## 2020-02-23 NOTE — Consult Note (Signed)
Consultation Note Date: 03/10/2020   Patient Name: Sherry Arnold  DOB: [birth year]  MRN: [id number]  Age / Sex: 54 y.o., female  PCP: Bridget Hartshorn, NP Referring Physician: Rodena Goldmann, DO  Reason for Consultation: Establishing goals of care and Terminal Care  HPI/Patient Profile: 54 y.o. female  with past medical history of metastatic uterine leiomyosarcoma- was on hospice care at home with [facility], but revoked to come to the hospital last night admitted on 03/12/2020 with bowel perforation and positive blood cultures. Markedly hypotensive yesterday, but improved after fluid resuscitation and antibiotics. Per attending discussion with family she was transitioned to full comfort measures only. Palliative consulted for symptom management and assistance with disposition.  Clinical Assessment and Goals of Care: Evaluated patient- she had increased respiration rate, audible secretions. She was awake, but did not answer any of my questions. Looked a bit disoriented. Several friends and family members at bedside.  Called her husband Sherry Arnold to confirm goals of care. Sherry Arnold stated he understood that Sherry Arnold is dying- main goal is to ensure her comfort. We discussed discharge home with hospice or transition to hospice facility.  Sherry Arnold concerned re: stability to transfer and risk of her dying in transport as it was relayed to him by ED physician. He requested 24 hours to ensure her stability for transfer.  Discussed use of morphine for pain and shortness of breath- she would likely benefit from morphine infusion. He would like to come to hospital and discuss this with her.   Primary Decision Maker NEXT OF KIN- patient's spouse- Sherry Arnold    SUMMARY OF RECOMMENDATIONS -Continue current inteventions -Will reassess for stability for transfer per patient's family request -TOC order for referral to re-enroll with [facility]    -D/C IV fluids  Code Status/Advance Care Planning:  DNR  Additional Recommendations (Limitations, Scope, Preferences):  Full Comfort Care  Prognosis:    < 2 weeks  Discharge Planning: To Be Determined- likely home with Hospice tomorrow if she continues to be stable  Primary Diagnoses: Present on Admission: . Metastatic cancer ([place]) . Uterine leiomyosarcoma ([place]) . 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 AKI (acute kidney injury) ([place]) . Hyponatremia   I have reviewed the medical record, interviewed the patient and family, and examined the patient. The following aspects are pertinent.  Past Medical History:  Diagnosis Date  . Cancer ([place])   . Hypertension    Social History   Socioeconomic History  . Marital status: Married    Spouse name: Not on file  . Number of children: Not on file  . Years of education: Not on file  . Highest education level: Not on file  Occupational History  . Not on file  Tobacco Use  . Smoking status: Never Smoker  . Smokeless tobacco: Never Used  Substance and Sexual Activity  . Alcohol use: Not on file  . Drug use: Not on file  . Sexual activity: Not on file  Other Topics Concern  . Not on file  Social History Narrative  . Not on  file   Social Determinants of Health   Financial Resource Strain:   . Difficulty of Paying Living Expenses: Not on file  Food Insecurity:   . Worried About Charity fundraiser in the Last Year: Not on file  . Ran Out of Food in the Last Year: Not on file  Transportation Needs:   . Lack of Transportation (Medical): Not on file  . Lack of Transportation (Non-Medical): Not on file  Physical Activity:   . Days of Exercise per Week: Not on file  . Minutes of Exercise per Session: Not on file  Stress:   . Feeling of Stress : Not on file  Social Connections:   . Frequency of Communication with Friends and Family: Not on file  . Frequency of Social Gatherings with Friends and Family: Not on file  . Attends  Religious Services: Not on file  . Active Member of Clubs or Organizations: Not on file  . Attends Archivist Meetings: Not on file  . Marital Status: Not on file   History reviewed. No pertinent family history. Scheduled Meds: .  morphine injection  4 mg Intravenous Once   Continuous Infusions: PRN Meds:.acetaminophen **OR** acetaminophen, antiseptic oral rinse, glycopyrrolate **OR** glycopyrrolate **OR** glycopyrrolate, haloperidol **OR** haloperidol **OR** haloperidol lactate, LORazepam **OR** [DISCONTINUED] LORazepam **OR** LORazepam, morphine injection, ondansetron **OR** ondansetron (ZOFRAN) IV, polyvinyl alcohol Medications Prior to Admission:  Prior to Admission medications   Medication Sig Start Date End Date Taking? Authorizing Provider  ibuprofen (ADVIL) 800 MG tablet Take 800 mg by mouth every 8 (eight) hours as needed. 12/24/19  Yes [provider]  lisinopril-hydrochlorothiazide (ZESTORETIC) 20-25 MG tablet Take 1 tablet by mouth daily. 12/29/19  Yes [provider]  Vitamin D, Ergocalciferol, (DRISDOL) 1.25 MG (50000 UNIT) CAPS capsule Take 50,000 Units by mouth once a week. 12/20/19  Yes [provider]   No Known Allergies Review of Systems  Unable to perform ROS: Acuity of condition    Physical Exam Vitals and nursing note reviewed.  Constitutional:      General: She is not in acute distress. Pulmonary:     Comments: Increased effort and rate Abdominal:     General: There is distension.  Skin:    Coloration: Skin is pale.  Neurological:     Mental Status: She is alert. She is disoriented.     Vital Signs: BP (!) 140/123 (BP Location: Right Arm)   Pulse 88   Temp 97.6 F (36.4 C)   Resp (!) 24   Ht 5\' 5"  (1.651 m)   Wt 117.9 kg   SpO2 (!) 77%   BMI 43.25 kg/m  Pain Scale: 0-10   Pain Score: 0-No pain   SpO2: SpO2:  (comfort care) O2 Device:SpO2:  (comfort care) O2 Flow Rate: .O2 Flow Rate (L/min): 3 L/min  IO:  Intake/output summary:   Intake/Output Summary (Last 24 hours) at 03/12/2020 1104 Last data filed at 02/19/2020 0413 Gross per 24 hour  Intake 0 ml  Output --  Net 0 ml    LBM: Last BM Date: 02/21/20 Baseline Weight: Weight: 111.1 kg Most recent weight: Weight: 117.9 kg     Palliative Assessment/Data: PPS: 20%     Thank you for this consult. Palliative medicine will continue to follow and assist as needed.   Time In: 1013 Time Out: 1146 Time Total: 87 mins Greater than 50%  of this time was spent counseling and coordinating care related to the above assessment and plan.  Signed by: Mariana Kaufman, AGNP-C Palliative Medicine    Please contact Palliative Medicine Team phone at [phone] for questions and concerns.  For individual provider: See Shea Evans

## 2020-02-23 NOTE — TOC Progression Note (Signed)
Transition of Care [facility]) - Progression Note    Patient Details  Name: Sherry Arnold MRN: [id number] Date of Birth: 1966/06/13  Transition of Care [facility]) CM/SW Contact  Natasha Bence, LCSW Phone Number: 03/13/2020, 4:05 PM  Clinical Narrative:    CSW received Hospice referral for patient. CSW identified that patient currently was receiving services for [place] with [facility]. [facility] reported difficulty reaching patient and stated that not been notified that she was hospitalized. MD reported that patient will likely need to be referred for GIP on Mar 09, 2020. CSW notified [facility] of upcoming GIP referral. CSW was contacted by charge nurse with [facility] to provide updated clinicals. CSW faxed updated clinicals to the number provided.    Expected Discharge Plan: [place] Barriers to Discharge: Continued Medical Work up  Expected Discharge Plan and Services Expected Discharge Plan: [place] arrangements for the past 2 months: Single Family Home                                       Social Determinants of Health (SDOH) Interventions    Readmission Risk Interventions No flowsheet data found.

## 2020-02-23 NOTE — Progress Notes (Signed)
CRITICAL VALUE ALERT  Critical Value:  AEROBIC AND ANAEROBIC BOTTLES  GRAM NEGATIVE RODS   Date & Time Notied:  03/04/2020 0400  Provider Notified: Josephine Cables  Orders Received/Actions taken:

## 2020-02-23 NOTE — Progress Notes (Signed)
PROGRESS NOTE    Sherry Arnold  SWH:[id number] DOB: 09-08-1965 DOA: 03/14/2020 PCP: Bridget Hartshorn, NP   Brief Narrative:  Per HPI: Sherry Arnold is a 54 y.o. female with a past medical history of metastatic uterine cancer who was under hospice care at home.  She was seen by oncology at [facility].  It appears the patient was diagnosed with the cancer back in May.  She apparently underwent radiation treatments but does not look like she received any chemotherapy.  Unclear when the patient was transitioned to hospice but the patient presented mainly because of her weakness poor oral intake and abdominal pain.  She also developed abdominal distention.  She presented to the ED.  Currently patient is noted to be somewhat drowsy after receiving some medications.  Her husband is at the bedside.  Patient mentions that her abdominal pain is better at this time.  In the emergency department she underwent CT scan which showed evidence for bowel perforation likely secondary to a large mass noted in the abdomen which was encasing the bowel loops.  Patient has extremely poor prognosis.  She is not a surgical candidate.  This is likely a terminal event.  Patient and her husband understand.  She is also noted to be hypotensive.  Currently not stable to be sent back home.  She would however like to go home tomorrow if possible.  She will be hospitalized for pain control.  9/9: Patient admitted for comfort care secondary to acute bowel perforation in the setting of metastatic uterine leiomyosarcoma. She is currently under hospice care at home.  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 If she happens to remain stable by 9/10, plan is for return to home hospice.   DVT prophylaxis: None Code Status: DNR-comfort care Family Communication: Discussed with husband at bedside Disposition Plan:  Status is: Observation  The patient will require care spanning > 2 midnights and should be moved to inpatient because: IV treatments appropriate due to intensity of illness or inability to take PO and Inpatient level of care appropriate due to severity of illness  Dispo: The patient is from: Home              Anticipated d/c is to: Home              Anticipated d/c date is: 1 day              Patient currently is not medically stable to d/c. Patient requires IV medications for pain management.  Consultants:   Palliative care  Procedures:   See below  Antimicrobials:   None   Subjective: Patient seen and evaluated today and appears to be resting comfortably. She was noted to have some shortness of breath later on for which further morphine was ordered.  Objective: Vitals:   03/10/2020 1755 02/15/2020 1756 03/03/2020 1900 02/28/2020 2028  BP:  (!) 74/44 (!) 50/34 (!) 140/123  Pulse: 98  63 88  Resp:   (!) 21 (!) 24  Temp:    97.6 F (36.4 C)  TempSrc:      SpO2: 98%  (!) 77%   Weight:    117.9 kg  Height:        Intake/Output Summary (Last 24 hours) at 03/07/2020 1119 Last data filed at 03/01/2020 0413 Gross per 24 hour  Intake 0 ml  Output --  Net 0 ml   Filed Weights   02/26/2020 1246 03/06/2020 2028  Weight: 111.1 kg 117.9 kg    Examination:  General exam: Patient is sleepy. Respiratory system: Clear to auscultation. Respiratory effort normal. Currently on nasal cannula. Cardiovascular system: S1 & S2 heard,  RRR.  Gastrointestinal system: Abdomen is distended and tender Central nervous system: Currently somnolent Extremities: No edema Skin: No rashes, lesions or ulcers Psychiatry: Cannot be adequately assessed.    Data Reviewed: I have personally reviewed following labs and imaging studies  CBC: Recent Labs  Lab 02/28/2020 1423  WBC 17.2*  NEUTROABS 16.2*  HGB 10.1*  HCT 30.2*  MCV 94.7  PLT 151   Basic Metabolic Panel: Recent Labs  Lab 02/28/2020 1423  NA 126*  K 3.7  CL 89*  CO2 20*  GLUCOSE 106*  BUN 76*  CREATININE 3.78*  CALCIUM 7.6*   GFR: Estimated Creatinine Clearance: 21.4 mL/min (A) (by C-G formula based on SCr of 3.78 mg/dL (H)). Liver Function Tests: Recent Labs  Lab 03/03/2020 1423  AST 14*  ALT 20  ALKPHOS 77  BILITOT 1.1  PROT 5.5*  ALBUMIN 2.1*   No results for input(s): LIPASE, AMYLASE in the last 168 hours. No results for input(s): AMMONIA in the last 168 hours. Coagulation Profile: No results for input(s): INR, PROTIME in the last 168 hours. Cardiac Enzymes: No results for input(s): CKTOTAL, CKMB, CKMBINDEX, TROPONINI in the last 168 hours. BNP (last 3 results) No results for input(s): PROBNP in the last 8760 hours. HbA1C: No results for input(s): HGBA1C in the last 72 hours. CBG: No results for input(s): GLUCAP in the last 168 hours. Lipid Profile: No results for input(s): CHOL, HDL, LDLCALC, TRIG, CHOLHDL, LDLDIRECT in the last 72 hours. Thyroid Function Tests: No results for input(s): TSH, T4TOTAL, FREET4, T3FREE, THYROIDAB in the last 72 hours. Anemia Panel: No results for input(s): VITAMINB12, FOLATE, FERRITIN, TIBC, IRON, RETICCTPCT in the last 72 hours. Sepsis Labs: Recent Labs  Lab 03/06/2020 1423  LATICACIDVEN 2.1*    Recent Results (from the past 240 hour(s))  SARS Coronavirus 2 by RT PCR (hospital order, performed in [facility] hospital lab) Nasopharyngeal Nasopharyngeal Swab     Status: None   Collection Time: 02/15/2020   1:54 PM   Specimen: Nasopharyngeal Swab  Result Value Ref Range Status   SARS Coronavirus 2 NEGATIVE NEGATIVE Final    Comment: (NOTE) SARS-CoV-2 target nucleic acids are NOT DETECTED.  The SARS-CoV-2 RNA is generally detectable in upper and lower respiratory specimens during the acute phase of infection. The lowest concentration of SARS-CoV-2 viral copies this assay can detect is 250 copies / mL. A negative result does not preclude SARS-CoV-2 infection and should not be used as the sole basis for treatment or other patient management decisions.  A negative result may occur with improper specimen collection / handling, submission of specimen other than nasopharyngeal swab, presence of viral mutation(s) within the areas targeted by this assay, and inadequate number of viral copies (<250 copies / mL). A negative result must be combined with  clinical observations, patient history, and epidemiological information.  Fact Sheet for Patients:   StrictlyIdeas.no  Fact Sheet for Healthcare Providers: BankingDealers.co.za  This test is not yet approved or  cleared by the [place] FDA and has been authorized for detection and/or diagnosis of SARS-CoV-2 by FDA under an Emergency Use Authorization (EUA).  This EUA will remain in effect (meaning this test can be used) for the duration of the COVID-19 declaration under Section 564(b)(1) of the Act, 21 U.S.C. section 360bbb-3(b)(1), unless the authorization is terminated or revoked sooner.  Performed at [facility], [address], [place], [place] [address]   Blood culture (routine single)     Status: None (Preliminary result)   Collection Time: 02/15/2020  2:28 PM   Specimen: BLOOD  Result Value Ref Range Status   Specimen Description BLOOD LEFT ANTECUBITAL  Final   Special Requests   Final    BOTTLES DRAWN AEROBIC AND ANAEROBIC Blood Culture adequate volume   Culture  Setup Time   Final     IN BOTH AEROBIC AND ANAEROBIC BOTTLES GRAM NEGATIVE RODS Gram Stain Report Called to,Read Back By and Verified With: C THOMAS,RN @0357  02/20/2020 MKELLY Organism ID to follow Performed at [facility], [place] [address], [place], [place] [address]    Culture PENDING  Incomplete   Report Status PENDING  Incomplete  Blood Culture ID Panel (Reflexed)     Status: Abnormal   Collection Time: 03/11/2020  2:28 PM  Result Value Ref Range Status   Enterococcus faecalis NOT DETECTED NOT DETECTED Final   Enterococcus Faecium NOT DETECTED NOT DETECTED Final   Listeria monocytogenes NOT DETECTED NOT DETECTED Final   Staphylococcus species NOT DETECTED NOT DETECTED Final   Staphylococcus aureus (BCID) NOT DETECTED NOT DETECTED Final   Staphylococcus epidermidis NOT DETECTED NOT DETECTED Final   Staphylococcus lugdunensis NOT DETECTED NOT DETECTED Final   Streptococcus species NOT DETECTED NOT DETECTED Final   Streptococcus agalactiae NOT DETECTED NOT DETECTED Final   Streptococcus pneumoniae NOT DETECTED NOT DETECTED Final   Streptococcus pyogenes NOT DETECTED NOT DETECTED Final   A.calcoaceticus-baumannii NOT DETECTED NOT DETECTED Final   Bacteroides fragilis NOT DETECTED NOT DETECTED Final   Enterobacterales DETECTED (A) NOT DETECTED Final    Comment: Enterobacterales represent a large order of gram negative bacteria, not a single organism. CRITICAL RESULT CALLED TO, READ BACK BY AND VERIFIED WITH: G. Coffee PharmD 10:30 03/06/2020 ([email or web address])    Enterobacter cloacae complex NOT DETECTED NOT DETECTED Final   Escherichia coli NOT DETECTED NOT DETECTED Final   Klebsiella aerogenes NOT DETECTED NOT DETECTED Final   Klebsiella oxytoca NOT DETECTED NOT DETECTED Final   Klebsiella pneumoniae NOT DETECTED NOT DETECTED Final   Proteus species DETECTED (A) NOT DETECTED Final    Comment: CRITICAL RESULT CALLED TO, READ BACK BY AND VERIFIED WITH: G. Coffee PharmD 10:30 03/07/2020 ([email or web address])    Salmonella species  NOT DETECTED NOT DETECTED Final   Serratia marcescens NOT DETECTED NOT DETECTED Final   Haemophilus influenzae NOT DETECTED NOT DETECTED Final   Neisseria meningitidis NOT DETECTED NOT DETECTED Final   Pseudomonas aeruginosa NOT DETECTED NOT DETECTED Final   Stenotrophomonas maltophilia NOT DETECTED NOT DETECTED Final   Candida albicans NOT DETECTED NOT DETECTED Final   Candida auris NOT DETECTED NOT DETECTED Final   Candida glabrata NOT DETECTED NOT DETECTED Final   Candida krusei NOT DETECTED NOT DETECTED Final   Candida parapsilosis NOT DETECTED NOT DETECTED Final   Candida tropicalis NOT DETECTED  NOT DETECTED Final   Cryptococcus neoformans/gattii NOT DETECTED NOT DETECTED Final   CTX-M ESBL NOT DETECTED NOT DETECTED Final   Carbapenem resistance IMP NOT DETECTED NOT DETECTED Final   Carbapenem resistance KPC NOT DETECTED NOT DETECTED Final   Carbapenem resistance NDM NOT DETECTED NOT DETECTED Final   Carbapenem resist OXA 48 LIKE NOT DETECTED NOT DETECTED Final   Carbapenem resistance VIM NOT DETECTED NOT DETECTED Final    Comment: Performed at [facility], [place] [address], [place], [place] [address]         Radiology Studies: CT Abdomen Pelvis Wo Contrast  Result Date: 03/15/2020 CLINICAL DATA:  54 year old female with generalized abdominal pain. Metastatic uterine cancer under hospice care. EXAM: CT ABDOMEN AND PELVIS WITHOUT CONTRAST TECHNIQUE: Multidetector CT imaging of the abdomen and pelvis was performed following the standard protocol without IV contrast. COMPARISON:  None. FINDINGS: Lower chest: Multiple round lung base metastases. Superimposed bilateral peribronchial thickening or atelectasis. No pericardial or pleural effusion. Hepatobiliary: Absent gallbladder. No liver metastasis is evident in the absence of contrast. Pancreas: Negative. Spleen: Negative. Adrenals/Urinary Tract: Negative adrenal glands. Left renal midpole low-density cyst. No renal obstruction.  Urinary bladder is partially inseparable from bulky pelvic tumor, although is seen to be decompressed on sagittal image 86. Stomach/Bowel: Large volume free intraperitoneal air. Bulky soft tissue tumor arising out of the pelvis is inseparable from small bowel loops in the mid abdomen (series 2, image 79 and coronal image 63), the sigmoid colon in the lower abdomen and pelvis (series 2, image 75) and also inseparable from the retroperitoneal portion of the duodenum (series 2, image 43). Fluid in the distal large bowel. Gas distended transverse colon. Terminal ileum and cecum appear fairly normal. No dilated small bowel. Vascular/Lymphatic: Normal caliber aorta. Vascular patency is not evaluated in the absence of IV contrast. Aortoiliac calcified atherosclerosis. No portal venous gas identified. No lymphadenopathy. Reproductive: Lobulated nearly 30 cm soft tissue mass tracks from the central pelvis cephalad to the level of the duodenum (sagittal image 77) and is nearly 30 cm transverse at the level of the mid abdomen also (series 2, image 65). No normal uterine or adnexal parenchyma. Other: No pelvic free fluid. Musculoskeletal: Lytic metastases in the lumbar spine and sacrum. Expansile osseous metastasis in the right lateral 9th rib. Lytic metastases in the bilateral iliac bones, pubic rami and proximal right femur. Bulky extraosseous extension of tumor from the right inferior pubic symphysis. No overt pathologic fracture identified. IMPRESSION: 1. Large volume of free air in the abdomen in keeping with perforated bowel secondary to a large (30 cm) tumor in the abdomen and pelvis which engulfs both small and large bowel loops. 2. Extensive lytic skeletal metastases. Pulmonary metastases at the lung bases. 3. Critical Value/emergent results were called by telephone at the time of interpretation on 03/03/2020 at 5:44 pm to provider Dr. Sedonia Small in the ED, who verbally acknowledged these results. Electronically Signed   By: Genevie Ann M.D.   On: 03/09/2020 17:49   DG Chest Port 1 View  Result Date: 02/21/2020 CLINICAL DATA:  Sepsis, cough. EXAM: PORTABLE CHEST 1 VIEW COMPARISON:  None. FINDINGS: The heart size and mediastinal contours are within normal limits. Hypoinflation of the lungs is noted with mild bibasilar subsegmental atelectasis. No pneumothorax or pleural effusion is noted. However, free air is noted under both hemidiaphragms consistent with pneumoperitoneum and concerning for rupture of hollow viscus. The visualized skeletal structures are unremarkable. IMPRESSION: 1. Hypoinflation of the lungs  is noted with mild bibasilar subsegmental atelectasis. 2. Pneumoperitoneum is noted under both hemidiaphragms consistent with rupture of hollow viscus. CT scan of the abdomen and pelvis is recommended for further evaluation. Critical Value/emergent results were called by telephone at the time of interpretation on 03/08/2020 at 2:30 pm to provider Kem Parkinson, who verbally acknowledged these results and will relay them to Dr. Ashok Cordia. Electronically Signed   By: Marijo Conception M.D.   On: 02/16/2020 14:30        Scheduled Meds: .  morphine injection  4 mg Intravenous Once    LOS: 0 days    Time spent: 30 minutes    Clemence Stillings Darleen Crocker, DO [facility]  If 7PM-7AM, please contact night-coverage [email or web address] 03/06/2020, 11:19 AM

## 2020-02-26 LAB — CULTURE, BLOOD (SINGLE): Special Requests: ADEQUATE

## 2020-03-16 NOTE — Death Summary Note (Signed)
Physician Discharge Summary  Kamoria Lucien SWF:[id number] DOB: Oct 31, 1965 DOA: Mar 05, 2020  PCP: Bridget Hartshorn, NP  Admit date: 03/05/20  Death date: 03-06-20 09/18/2351  Admitted From:Home  Disposition:  Expired  Brief/Interim Summary: Per HPI: Sherry Tilleyis a 54 y.o.femalewith a past medical history of metastatic uterine cancer who was under hospice care at home. She was seen by oncology at [facility]. It appears the patient was diagnosed with the cancer back in May. She apparently underwent radiation treatments but does not look like she received any chemotherapy. Unclear when the patient was transitionedto hospice but the patient presented mainly because of her weakness poor oral intake and abdominal pain. She also developed abdominal distention. She presented to the ED. Currently patient is noted to be somewhat drowsy after receiving some medications. Her husband is at the bedside. Patient mentions that her abdominal pain is better at this time. In the emergency department she underwent CT scan which showed evidence for bowel perforation likely secondary to a large mass noted in the abdomen which was encasing the bowel loops. Patient has extremely poor prognosis. She is not a surgical candidate. This is likely a terminal event. Patient and her husband understand. She is also noted to be hypotensive. Currently not stable to be sent back home. She would however like to go home tomorrowif possible. She will be hospitalized for pain control.  03/07/2023: Patient admitted for comfort care secondary to acute bowel perforation in the setting of metastatic uterine leiomyosarcoma. She is currently under hospice care at home.  Patient was noted to have progressive dyspnea throughout the course of the day and required morphine infusion initiation for comfort.  She ultimately expired at 2351-09-18 on 03-06-2020.  Discharge Diagnoses:  Principal Problem:   End of life care Active Problems:    Metastatic cancer ([place])   Uterine leiomyosarcoma ([place])   Intestinal perforation ([place])   AKI (acute kidney injury) ([place])   Hyponatremia   Bowel perforation ([facility])   Advanced care planning/counseling discussion   Goals of care, counseling/discussion   Metastasis from cancer of uterus ([place])  Acute bowel perforation in the setting of metastatic uterine leiomyosarcoma  Proteus bacteremia -Likely related to above -No plans for antibiotic treatment due to comfort care status  Multiple lab abnormalities -To include hyponatremia, acute renal failure, leukocytosis, and anemia -No plans to follow up as she is comfort care  No Known Allergies  Consultations:  Palliative Care   Procedures/Studies: CT Abdomen Pelvis Wo Contrast  Result Date: March 05, 2020 CLINICAL DATA:  54 year old female with generalized abdominal pain. Metastatic uterine cancer under hospice care. EXAM: CT ABDOMEN AND PELVIS WITHOUT CONTRAST TECHNIQUE: Multidetector CT imaging of the abdomen and pelvis was performed following the standard protocol without IV contrast. COMPARISON:  None. FINDINGS: Lower chest: Multiple round lung base metastases. Superimposed bilateral peribronchial thickening or atelectasis. No pericardial or pleural effusion. Hepatobiliary: Absent gallbladder. No liver metastasis is evident in the absence of contrast. Pancreas: Negative. Spleen: Negative. Adrenals/Urinary Tract: Negative adrenal glands. Left renal midpole low-density cyst. No renal obstruction. Urinary bladder is partially inseparable from bulky pelvic tumor, although is seen to be decompressed on sagittal image 86. Stomach/Bowel: Large volume free intraperitoneal air. Bulky soft tissue tumor arising out of the pelvis is inseparable from small bowel loops in the mid abdomen (series 2, image 79 and coronal image 63), the sigmoid colon in the lower abdomen and pelvis (series 2, image 75) and also inseparable from the retroperitoneal portion of the  duodenum (series 2, image 43).  Fluid in the distal large bowel. Gas distended transverse colon. Terminal ileum and cecum appear fairly normal. No dilated small bowel. Vascular/Lymphatic: Normal caliber aorta. Vascular patency is not evaluated in the absence of IV contrast. Aortoiliac calcified atherosclerosis. No portal venous gas identified. No lymphadenopathy. Reproductive: Lobulated nearly 30 cm soft tissue mass tracks from the central pelvis cephalad to the level of the duodenum (sagittal image 77) and is nearly 30 cm transverse at the level of the mid abdomen also (series 2, image 65). No normal uterine or adnexal parenchyma. Other: No pelvic free fluid. Musculoskeletal: Lytic metastases in the lumbar spine and sacrum. Expansile osseous metastasis in the right lateral 9th rib. Lytic metastases in the bilateral iliac bones, pubic rami and proximal right femur. Bulky extraosseous extension of tumor from the right inferior pubic symphysis. No overt pathologic fracture identified. IMPRESSION: 1. Large volume of free air in the abdomen in keeping with perforated bowel secondary to a large (30 cm) tumor in the abdomen and pelvis which engulfs both small and large bowel loops. 2. Extensive lytic skeletal metastases. Pulmonary metastases at the lung bases. 3. Critical Value/emergent results were called by telephone at the time of interpretation on 02/21/2020 at 5:44 pm to provider Dr. Sedonia Small in the ED, who verbally acknowledged these results. Electronically Signed   By: Genevie Ann M.D.   On:  17:49   DG Chest Port 1 View  Result Date: 03/11/2020 CLINICAL DATA:  Sepsis, cough. EXAM: PORTABLE CHEST 1 VIEW COMPARISON:  None. FINDINGS: The heart size and mediastinal contours are within normal limits. Hypoinflation of the lungs is noted with mild bibasilar subsegmental atelectasis. No pneumothorax or pleural effusion is noted. However, free air is noted under both hemidiaphragms consistent with pneumoperitoneum and  concerning for rupture of hollow viscus. The visualized skeletal structures are unremarkable. IMPRESSION: 1. Hypoinflation of the lungs is noted with mild bibasilar subsegmental atelectasis. 2. Pneumoperitoneum is noted under both hemidiaphragms consistent with rupture of hollow viscus. CT scan of the abdomen and pelvis is recommended for further evaluation. Critical Value/emergent results were called by telephone at the time of interpretation on 02/26/2020 at 2:30 pm to provider Kem Parkinson, who verbally acknowledged these results and will relay them to Dr. Ashok Cordia. Electronically Signed   By: Marijo Conception M.D.   On: 03/11/2020 14:30     Discharge Exam: Vitals:   02/26/2020 1500 03/08/2020 2118  BP: (!) 62/36 (!) 59/30  Pulse: 99 (!) 102  Resp: 15 (!) 24  Temp: (!) 97.5 F (36.4 C) 98.4 F (36.9 C)  SpO2: 99% (!) 86%   Vitals:   02/18/2020 1900 03/11/2020 2028 02/21/2020 1500  2118  BP: (!) 50/34 (!) 140/123 (!) 62/36 (!) 59/30  Pulse: 63 88 99 (!) 102  Resp: (!) 21 (!) 24 15 (!) 24  Temp:  97.6 F (36.4 C) (!) 97.5 F (36.4 C) 98.4 F (36.9 C)  TempSrc:   Axillary   SpO2: (!) 77%  99% (!) 86%  Weight:  117.9 kg    Height:           The results of significant diagnostics from this hospitalization (including imaging, microbiology, ancillary and laboratory) are listed below for reference.     Microbiology: Recent Results (from the past 240 hour(s))  SARS Coronavirus 2 by RT PCR (hospital order, performed in [facility] hospital lab) Nasopharyngeal Nasopharyngeal Swab     Status: None   Collection Time: 02/28/2020  1:54 PM   Specimen: Nasopharyngeal Swab  Result Value Ref Range Status   SARS Coronavirus 2 NEGATIVE NEGATIVE Final    Comment: (NOTE) SARS-CoV-2 target nucleic acids are NOT DETECTED.  The SARS-CoV-2 RNA is generally detectable in upper and lower respiratory specimens during the acute phase of infection. The lowest concentration of SARS-CoV-2 viral copies this  assay can detect is 250 copies / mL. A negative result does not preclude SARS-CoV-2 infection and should not be used as the sole basis for treatment or other patient management decisions.  A negative result may occur with improper specimen collection / handling, submission of specimen other than nasopharyngeal swab, presence of viral mutation(s) within the areas targeted by this assay, and inadequate number of viral copies (<250 copies / mL). A negative result must be combined with clinical observations, patient history, and epidemiological information.  Fact Sheet for Patients:   StrictlyIdeas.no  Fact Sheet for Healthcare Providers: BankingDealers.co.za  This test is not yet approved or  cleared by the [place] FDA and has been authorized for detection and/or diagnosis of SARS-CoV-2 by FDA under an Emergency Use Authorization (EUA).  This EUA will remain in effect (meaning this test can be used) for the duration of the COVID-19 declaration under Section 564(b)(1) of the Act, 21 U.S.C. section 360bbb-3(b)(1), unless the authorization is terminated or revoked sooner.  Performed at [facility], [address], [place], [place] [address]   Blood culture (routine single)     Status: None (Preliminary result)   Collection Time: 03/04/2020  2:28 PM   Specimen: BLOOD  Result Value Ref Range Status   Specimen Description   Final    BLOOD LEFT ANTECUBITAL Performed at [place] [address], [place], [place] [address]    Special Requests   Final    BOTTLES DRAWN AEROBIC AND ANAEROBIC Blood Culture adequate volume Performed at [facility], [place] [address] [place], [place] [address]    Culture  Setup Time   Final    IN BOTH AEROBIC AND ANAEROBIC BOTTLES GRAM NEGATIVE RODS Gram Stain Report Called to,Read Back By and Verified With: C THOMAS,RN @0357  02/28/2020 MKELLY Organism ID to follow Performed at [facility], [place] [address], [place], [place] [address]    Culture   Final    NO GROWTH < 24 HOURS Performed at [facility], [address], [place], [place] [address]    Report Status PENDING  Incomplete  Blood Culture ID Panel (Reflexed)     Status: Abnormal   Collection Time: 02/19/2020  2:28 PM  Result Value Ref Range Status   Enterococcus faecalis NOT DETECTED NOT DETECTED Final   Enterococcus Faecium NOT DETECTED NOT DETECTED Final   Listeria monocytogenes NOT DETECTED NOT DETECTED Final   Staphylococcus species NOT DETECTED NOT DETECTED Final   Staphylococcus aureus (BCID) NOT DETECTED NOT DETECTED Final   Staphylococcus epidermidis NOT DETECTED NOT DETECTED Final   Staphylococcus lugdunensis NOT DETECTED NOT DETECTED Final   Streptococcus species NOT DETECTED NOT DETECTED Final   Streptococcus agalactiae NOT DETECTED NOT DETECTED Final   Streptococcus pneumoniae NOT DETECTED NOT DETECTED Final   Streptococcus pyogenes NOT DETECTED NOT DETECTED Final   A.calcoaceticus-baumannii NOT DETECTED NOT DETECTED Final   Bacteroides fragilis NOT DETECTED NOT DETECTED Final   Enterobacterales DETECTED (A) NOT DETECTED Final    Comment: Enterobacterales represent a large order of gram negative bacteria, not a single organism. CRITICAL RESULT CALLED TO, READ BACK BY AND VERIFIED WITH: G. Coffee PharmD 10:30 02/26/2020 (  [email or web address])    Enterobacter cloacae complex NOT DETECTED NOT DETECTED Final   Escherichia coli NOT DETECTED NOT DETECTED Final   Klebsiella aerogenes NOT DETECTED NOT DETECTED Final   Klebsiella oxytoca NOT DETECTED NOT DETECTED Final   Klebsiella pneumoniae NOT DETECTED NOT DETECTED Final   Proteus species DETECTED (A) NOT DETECTED Final    Comment: CRITICAL RESULT CALLED TO, READ BACK BY AND VERIFIED WITH: G. Coffee PharmD 10:30 02/29/2020 ([email or web address])    Salmonella species NOT DETECTED NOT DETECTED Final   Serratia marcescens NOT DETECTED NOT DETECTED Final   Haemophilus influenzae NOT  DETECTED NOT DETECTED Final   Neisseria meningitidis NOT DETECTED NOT DETECTED Final   Pseudomonas aeruginosa NOT DETECTED NOT DETECTED Final   Stenotrophomonas maltophilia NOT DETECTED NOT DETECTED Final   Candida albicans NOT DETECTED NOT DETECTED Final   Candida auris NOT DETECTED NOT DETECTED Final   Candida glabrata NOT DETECTED NOT DETECTED Final   Candida krusei NOT DETECTED NOT DETECTED Final   Candida parapsilosis NOT DETECTED NOT DETECTED Final   Candida tropicalis NOT DETECTED NOT DETECTED Final   Cryptococcus neoformans/gattii NOT DETECTED NOT DETECTED Final   CTX-M ESBL NOT DETECTED NOT DETECTED Final   Carbapenem resistance IMP NOT DETECTED NOT DETECTED Final   Carbapenem resistance KPC NOT DETECTED NOT DETECTED Final   Carbapenem resistance NDM NOT DETECTED NOT DETECTED Final   Carbapenem resist OXA 48 LIKE NOT DETECTED NOT DETECTED Final   Carbapenem resistance VIM NOT DETECTED NOT DETECTED Final    Comment: Performed at [facility], [address] [place], [place] [address]     Labs: BNP (last 3 results) No results for input(s): BNP in the last 8760 hours. Basic Metabolic Panel: Recent Labs  Lab 02/19/2020 1423  NA 126*  K 3.7  CL 89*  CO2 20*  GLUCOSE 106*  BUN 76*  CREATININE 3.78*  CALCIUM 7.6*   Liver Function Tests: Recent Labs  Lab 03/01/2020 1423  AST 14*  ALT 20  ALKPHOS 77  BILITOT 1.1  PROT 5.5*  ALBUMIN 2.1*   No results for input(s): LIPASE, AMYLASE in the last 168 hours. No results for input(s): AMMONIA in the last 168 hours. CBC: Recent Labs  Lab 02/26/2020 1423  WBC 17.2*  NEUTROABS 16.2*  HGB 10.1*  HCT 30.2*  MCV 94.7  PLT 234   Cardiac Enzymes: No results for input(s): CKTOTAL, CKMB, CKMBINDEX, TROPONINI in the last 168 hours. BNP: Invalid input(s): POCBNP CBG: No results for input(s): GLUCAP in the last 168 hours. D-Dimer No results for input(s): DDIMER in the last 72 hours. Hgb A1c No results for input(s):  HGBA1C in the last 72 hours. Lipid Profile No results for input(s): CHOL, HDL, LDLCALC, TRIG, CHOLHDL, LDLDIRECT in the last 72 hours. Thyroid function studies No results for input(s): TSH, T4TOTAL, T3FREE, THYROIDAB in the last 72 hours.  Invalid input(s): FREET3 Anemia work up No results for input(s): VITAMINB12, FOLATE, FERRITIN, TIBC, IRON, RETICCTPCT in the last 72 hours. Urinalysis No results found for: COLORURINE, APPEARANCEUR, [place], [place], [place], [place], [place], [place], PROTEINUR, UROBILINOGEN, NITRITE, LEUKOCYTESUR Sepsis Labs Invalid input(s): PROCALCITONIN,  WBC,  LACTICIDVEN Microbiology Recent Results (from the past 240 hour(s))  SARS Coronavirus 2 by RT PCR (hospital order, performed in [facility] hospital lab) Nasopharyngeal Nasopharyngeal Swab     Status: None   Collection Time:   1:54 PM   Specimen: Nasopharyngeal Swab  Result Value Ref Range Status   SARS Coronavirus 2 NEGATIVE NEGATIVE Final  Comment: (NOTE) SARS-CoV-2 target nucleic acids are NOT DETECTED.  The SARS-CoV-2 RNA is generally detectable in upper and lower respiratory specimens during the acute phase of infection. The lowest concentration of SARS-CoV-2 viral copies this assay can detect is 250 copies / mL. A negative result does not preclude SARS-CoV-2 infection and should not be used as the sole basis for treatment or other patient management decisions.  A negative result may occur with improper specimen collection / handling, submission of specimen other than nasopharyngeal swab, presence of viral mutation(s) within the areas targeted by this assay, and inadequate number of viral copies (<250 copies / mL). A negative result must be combined with clinical observations, patient history, and epidemiological information.  Fact Sheet for Patients:   StrictlyIdeas.no  Fact Sheet for Healthcare  Providers: BankingDealers.co.za  This test is not yet approved or  cleared by the [place] FDA and has been authorized for detection and/or diagnosis of SARS-CoV-2 by FDA under an Emergency Use Authorization (EUA).  This EUA will remain in effect (meaning this test can be used) for the duration of the COVID-19 declaration under Section 564(b)(1) of the Act, 21 U.S.C. section 360bbb-3(b)(1), unless the authorization is terminated or revoked sooner.  Performed at [facility], [address], [place], [place] [address]   Blood culture (routine single)     Status: None (Preliminary result)   Collection Time: 03/03/2020  2:28 PM   Specimen: BLOOD  Result Value Ref Range Status   Specimen Description   Final    BLOOD LEFT ANTECUBITAL Performed at [place] [address], [place], [place] [address]    Special Requests   Final    BOTTLES DRAWN AEROBIC AND ANAEROBIC Blood Culture adequate volume Performed at [facility], [place] [address] [place], [place] [address]    Culture  Setup Time   Final    IN BOTH AEROBIC AND ANAEROBIC BOTTLES GRAM NEGATIVE RODS Gram Stain Report Called to,Read Back By and Verified With: C THOMAS,RN @0357  03/11/2020 MKELLY Organism ID to follow Performed at [facility], [place] [address], [place], [place] [address]    Culture   Final    NO GROWTH < 24 HOURS Performed at [facility], [address], [place], [place] [address]    Report Status PENDING  Incomplete  Blood Culture ID Panel (Reflexed)     Status: Abnormal   Collection Time: 03/13/2020  2:28 PM  Result Value Ref Range Status   Enterococcus faecalis NOT DETECTED NOT DETECTED Final   Enterococcus Faecium NOT DETECTED NOT DETECTED Final   Listeria monocytogenes NOT DETECTED NOT DETECTED Final   Staphylococcus species NOT DETECTED NOT DETECTED Final   Staphylococcus aureus (BCID) NOT DETECTED NOT DETECTED Final   Staphylococcus epidermidis NOT DETECTED NOT  DETECTED Final   Staphylococcus lugdunensis NOT DETECTED NOT DETECTED Final   Streptococcus species NOT DETECTED NOT DETECTED Final   Streptococcus agalactiae NOT DETECTED NOT DETECTED Final   Streptococcus pneumoniae NOT DETECTED NOT DETECTED Final   Streptococcus pyogenes NOT DETECTED NOT DETECTED Final   A.calcoaceticus-baumannii NOT DETECTED NOT DETECTED Final   Bacteroides fragilis NOT DETECTED NOT DETECTED Final   Enterobacterales DETECTED (A) NOT DETECTED Final    Comment: Enterobacterales represent a large order of gram negative bacteria, not a single organism. CRITICAL RESULT CALLED TO, READ BACK BY AND VERIFIED WITH: G. Coffee PharmD 10:30 02/22/2020 ([email or web address])    Enterobacter cloacae complex NOT DETECTED NOT DETECTED Final   Escherichia coli  NOT DETECTED NOT DETECTED Final   Klebsiella aerogenes NOT DETECTED NOT DETECTED Final   Klebsiella oxytoca NOT DETECTED NOT DETECTED Final   Klebsiella pneumoniae NOT DETECTED NOT DETECTED Final   Proteus species DETECTED (A) NOT DETECTED Final    Comment: CRITICAL RESULT CALLED TO, READ BACK BY AND VERIFIED WITH: G. Coffee PharmD 10:30 03/13/2020 ([email or web address])    Salmonella species NOT DETECTED NOT DETECTED Final   Serratia marcescens NOT DETECTED NOT DETECTED Final   Haemophilus influenzae NOT DETECTED NOT DETECTED Final   Neisseria meningitidis NOT DETECTED NOT DETECTED Final   Pseudomonas aeruginosa NOT DETECTED NOT DETECTED Final   Stenotrophomonas maltophilia NOT DETECTED NOT DETECTED Final   Candida albicans NOT DETECTED NOT DETECTED Final   Candida auris NOT DETECTED NOT DETECTED Final   Candida glabrata NOT DETECTED NOT DETECTED Final   Candida krusei NOT DETECTED NOT DETECTED Final   Candida parapsilosis NOT DETECTED NOT DETECTED Final   Candida tropicalis NOT DETECTED NOT DETECTED Final   Cryptococcus neoformans/gattii NOT DETECTED NOT DETECTED Final   CTX-M ESBL NOT DETECTED NOT DETECTED Final   Carbapenem resistance IMP NOT  DETECTED NOT DETECTED Final   Carbapenem resistance KPC NOT DETECTED NOT DETECTED Final   Carbapenem resistance NDM NOT DETECTED NOT DETECTED Final   Carbapenem resist OXA 48 LIKE NOT DETECTED NOT DETECTED Final   Carbapenem resistance VIM NOT DETECTED NOT DETECTED Final    Comment: Performed at [facility], [address], [place], [place] [address]     Time coordinating discharge: 35 minutes  SIGNED:   Rodena Goldmann, DO [facility] 16-Mar-2020, 7:00 AM  If 7PM-7AM, please contact night-coverage [email or web address]

## 2020-03-16 NOTE — Progress Notes (Signed)
Family at bedside. 

## 2020-03-16 NOTE — Progress Notes (Addendum)
Patient expired on 03/11/2020 at 2353. No respirations, no pulse. Two RN's to pronounce death, Deeann Dowse, RN and Reather Converse, RN. MD notified. Family notified and on the way to hospital.

## 2020-03-16 DEATH — deceased

## 2022-04-14 IMAGING — DX DG CHEST 1V PORT
1 series · 1 of 1 positions shown · non-contrast
Comparison: None.

CLINICAL DATA: Sepsis, cough.

EXAM:
PORTABLE CHEST 1 VIEW

[chest ap]
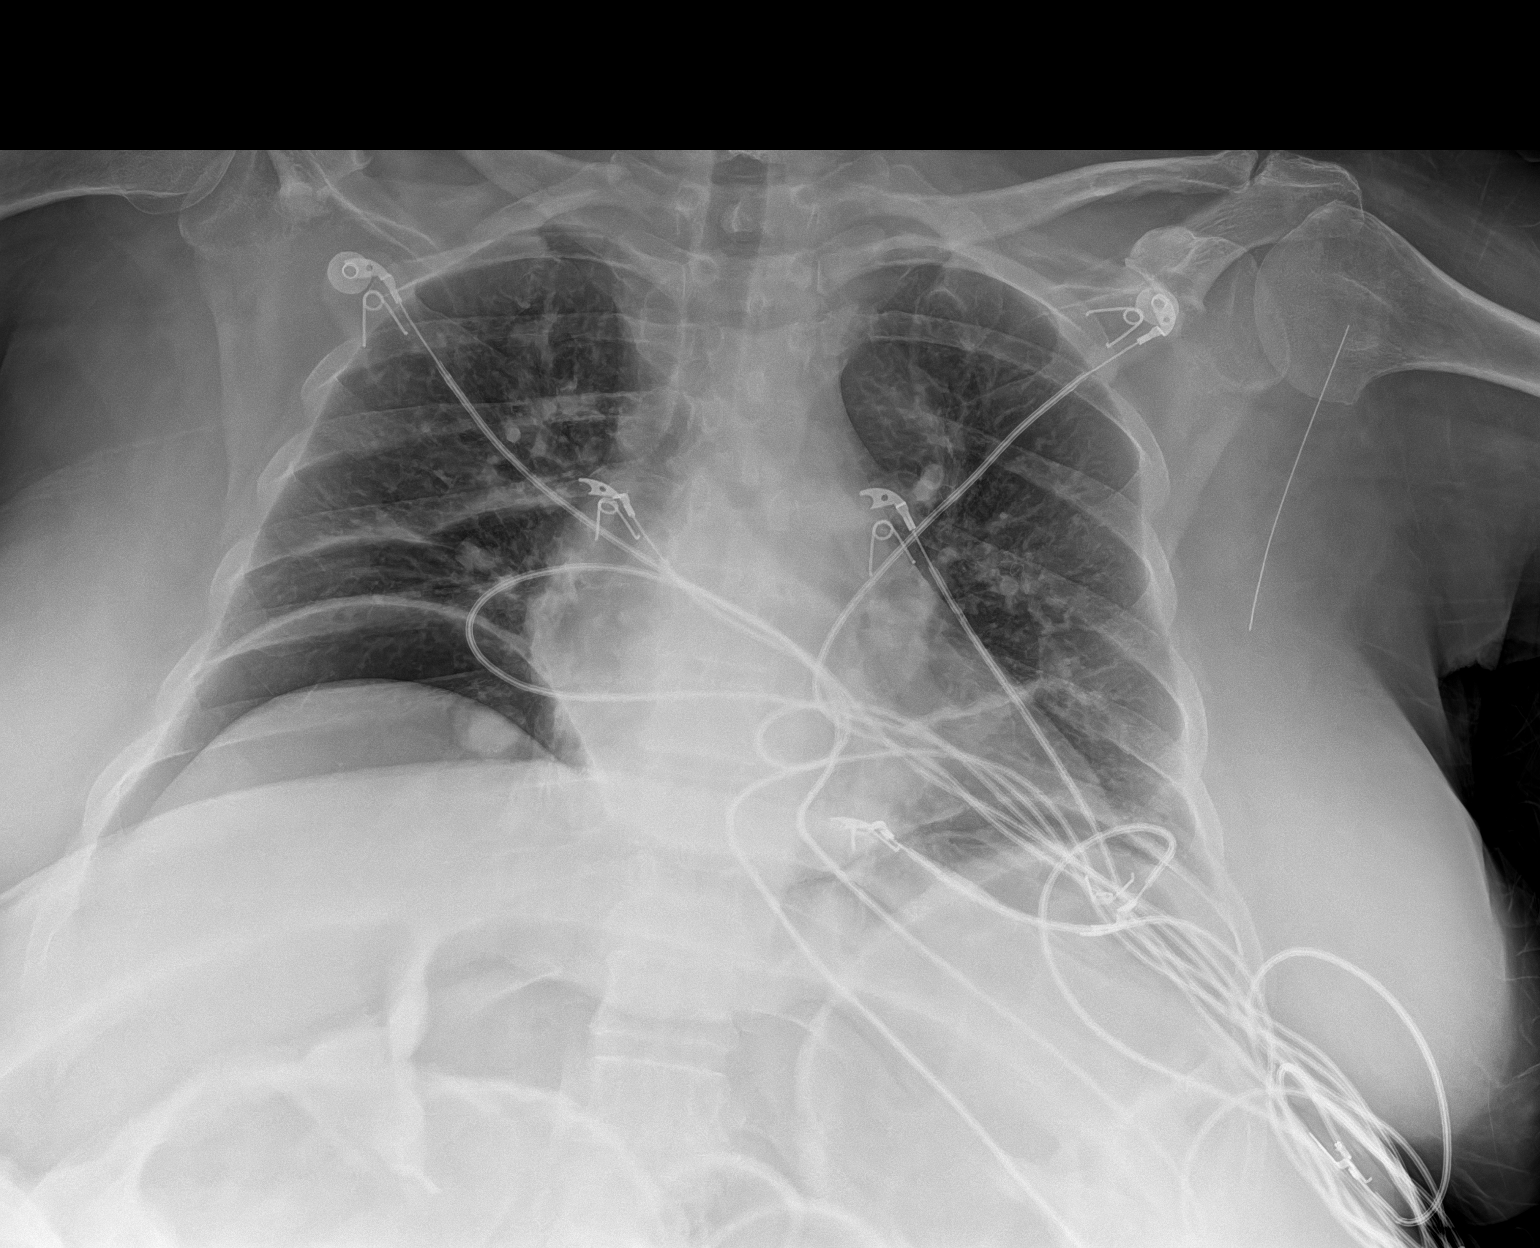

[1 of 1 positions shown; findings below may reference images not displayed]

FINDINGS: The heart size and mediastinal contours are within normal limits.
Hypoinflation of the lungs is noted with mild bibasilar subsegmental
atelectasis. No pneumothorax or pleural effusion is noted. However,
free air is noted under both hemidiaphragms consistent with
pneumoperitoneum and concerning for rupture of hollow viscus. The
visualized skeletal structures are unremarkable.
IMPRESSION: 1. Hypoinflation of the lungs is noted with mild bibasilar
subsegmental atelectasis.
2. Pneumoperitoneum is noted under both hemidiaphragms consistent
with rupture of hollow viscus. CT scan of the abdomen and pelvis is
recommended for further evaluation. Critical Value/emergent results
were called by telephone at the time of interpretation on 02/22/2020
at [DATE] to provider Heli Proulx, who verbally acknowledged
these results and will relay them to Dr. Minaxan.
